# Patient Record
Sex: Male | Born: 1960 | Race: White | Hispanic: No
Health system: Southern US, Community
[De-identification: ages and names within clinical notes are randomized; demographics above are authoritative.]

---

## 2020-03-07 ENCOUNTER — Emergency Department (HOSPITAL_COMMUNITY): Payer: Medicare HMO

## 2020-03-07 ENCOUNTER — Emergency Department (HOSPITAL_COMMUNITY)
Admission: EM | Admit: 2020-03-07 | Discharge: 2020-03-07 | Disposition: A | Discharge status: Home / Self Care | Payer: Medicare HMO | Attending: Emergency Medicine | Admitting: Emergency Medicine

## 2020-03-07 ENCOUNTER — Other Ambulatory Visit: Payer: Self-pay

## 2020-03-07 ENCOUNTER — Encounter (HOSPITAL_COMMUNITY): Payer: Self-pay | Admitting: Emergency Medicine

## 2020-03-07 DIAGNOSIS — Z5321 Procedure and treatment not carried out due to patient leaving prior to being seen by health care provider: Secondary | ICD-10-CM | POA: Insufficient documentation

## 2020-03-07 DIAGNOSIS — R0789 Other chest pain: Secondary | ICD-10-CM | POA: Insufficient documentation

## 2020-03-07 LAB — BASIC METABOLIC PANEL
Anion gap: 13 (ref 5–15)
BUN: 17 mg/dL (ref 6–20)
CO2: 24 mmol/L (ref 22–32)
Calcium: 8.7 mg/dL — ABNORMAL LOW (ref 8.9–10.3)
Chloride: 98 mmol/L (ref 98–111)
Creatinine, Ser: 1.28 mg/dL — ABNORMAL HIGH (ref 0.61–1.24)
GFR calc Af Amer: >60 mL/min (ref >60)
GFR calc non Af Amer: >60 mL/min (ref >60)
Glucose, Bld: 108 mg/dL — ABNORMAL HIGH (ref 70–99)
Potassium: 4 mmol/L (ref 3.5–5.1)
Sodium: 135 mmol/L (ref 135–145)

## 2020-03-07 LAB — CBC
HCT: 41 % (ref 39.0–52.0)
Hemoglobin: 13.6 g/dL (ref 13.0–17.0)
MCH: 31 pg (ref 26.0–34.0)
MCHC: 33.2 g/dL (ref 30.0–36.0)
MCV: 93.4 fL (ref 80.0–100.0)
Platelets: 120 10*3/uL — ABNORMAL LOW (ref 150–400)
RBC: 4.39 MIL/uL (ref 4.22–5.81)
RDW: 13.4 % (ref 11.5–15.5)
WBC: 6.9 10*3/uL (ref 4.0–10.5)
nRBC: 0 % (ref 0.0–0.2)

## 2020-03-07 LAB — TROPONIN I (HIGH SENSITIVITY): Troponin I (High Sensitivity): 3 ng/L (ref <18)

## 2020-03-07 MED ORDER — SODIUM CHLORIDE 0.9% FLUSH: 3.0000 mL | Freq: Once | INTRAVENOUS | Status: DC

## 2020-03-07 NOTE — ED Notes (Signed)
Pt called to be roomed to 45. Did not respond to name being called. No where to be found in lobby and triage.

## 2020-03-07 NOTE — ED Triage Notes (Signed)
Pt arrives to ED from his eye doctor after a vitrectomy procedure in which he was sedated with versed and fentanyl. Per EMS when patient was in recovery he started to complain of tightness in his left chest. Patient states he is currently on a heart monitor for constat chest pain but this chest pain was different because it was left sided and not substernal.  Patient received x1 nitro with slight relief in chest pain.

## 2021-07-05 IMAGING — CR DG CHEST 2V
2 series · 2 of 2 positions shown · non-contrast
Comparison: None.

CLINICAL DATA: Chest pain and shortness of breath.

EXAM:
CHEST - 2 VIEW

[chest lat]
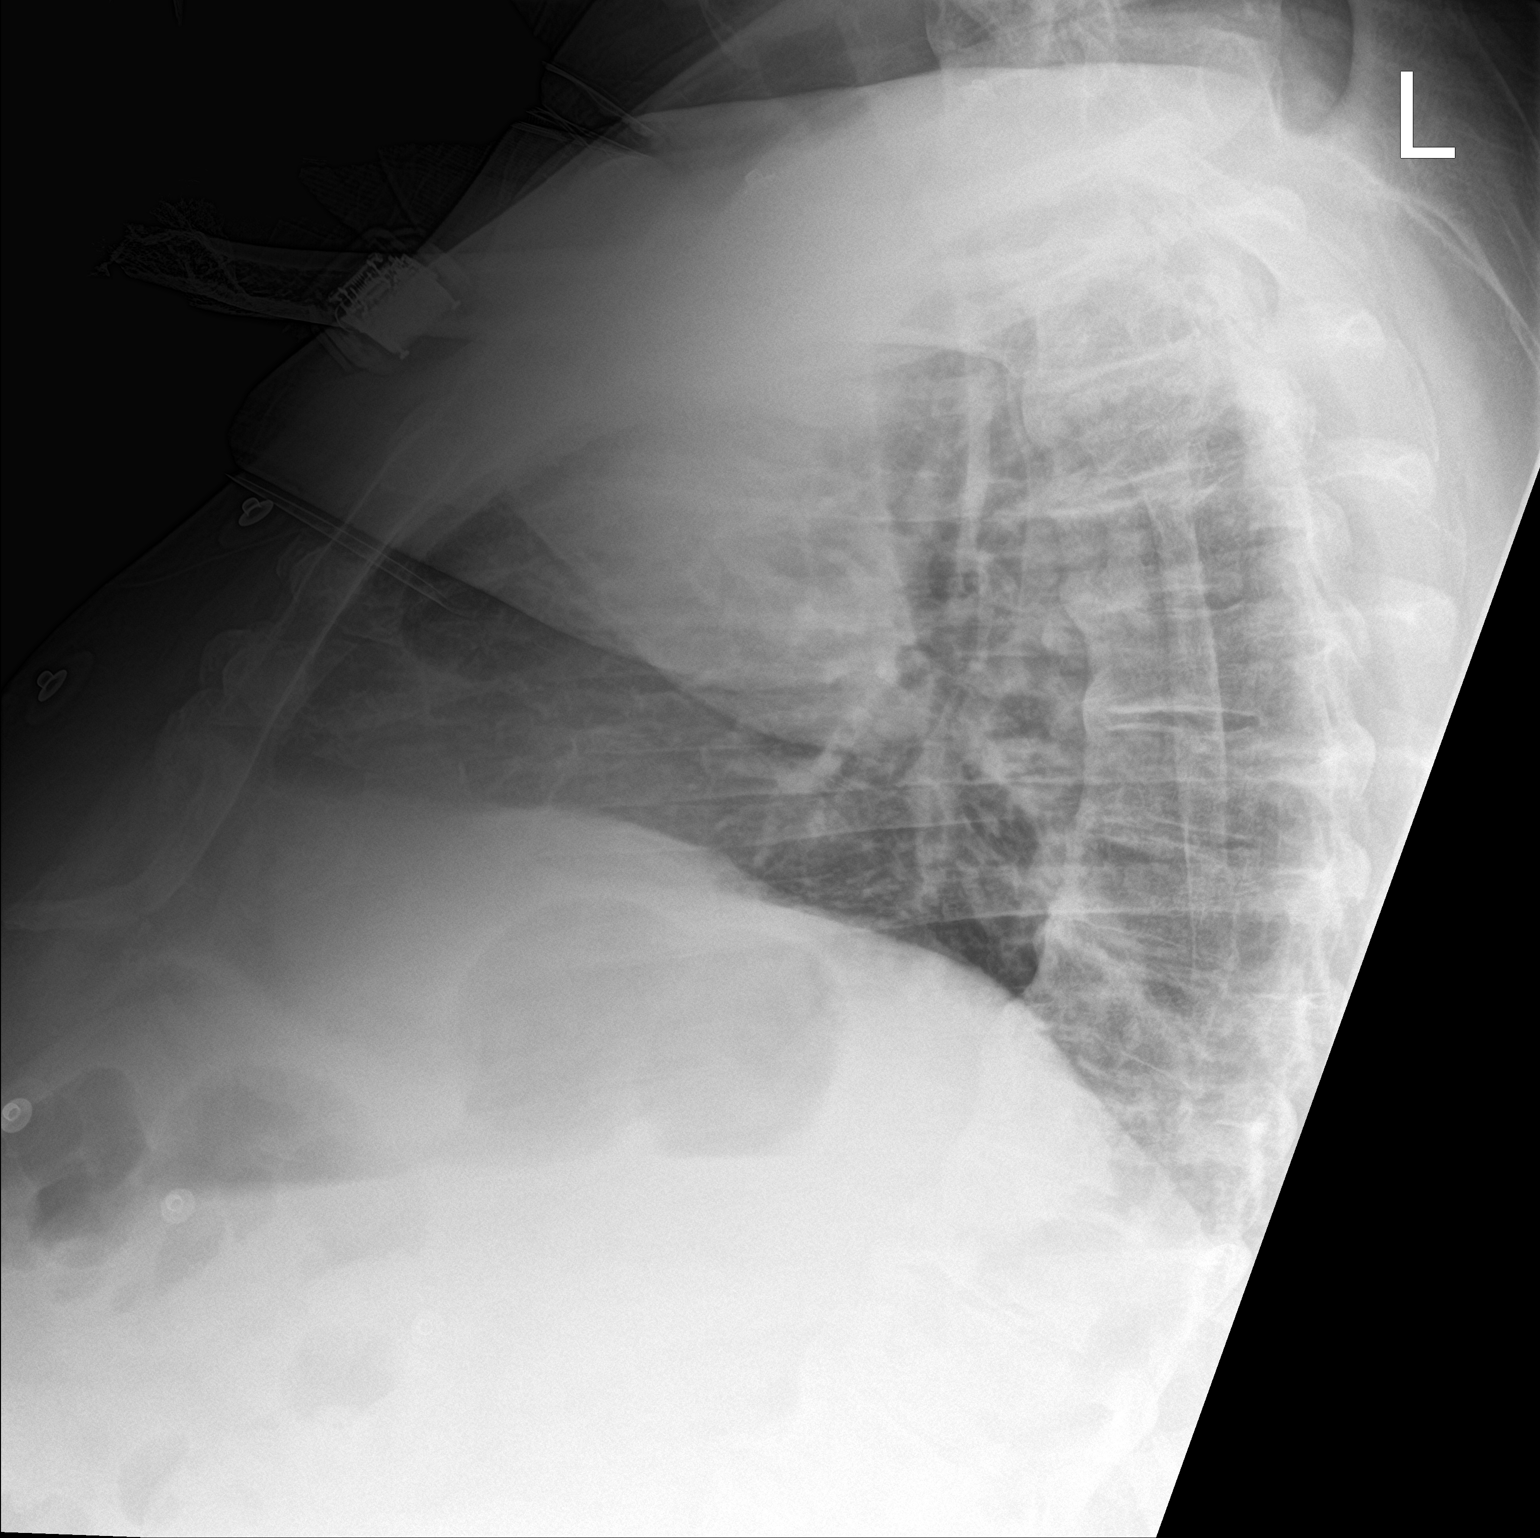

[chest ap]
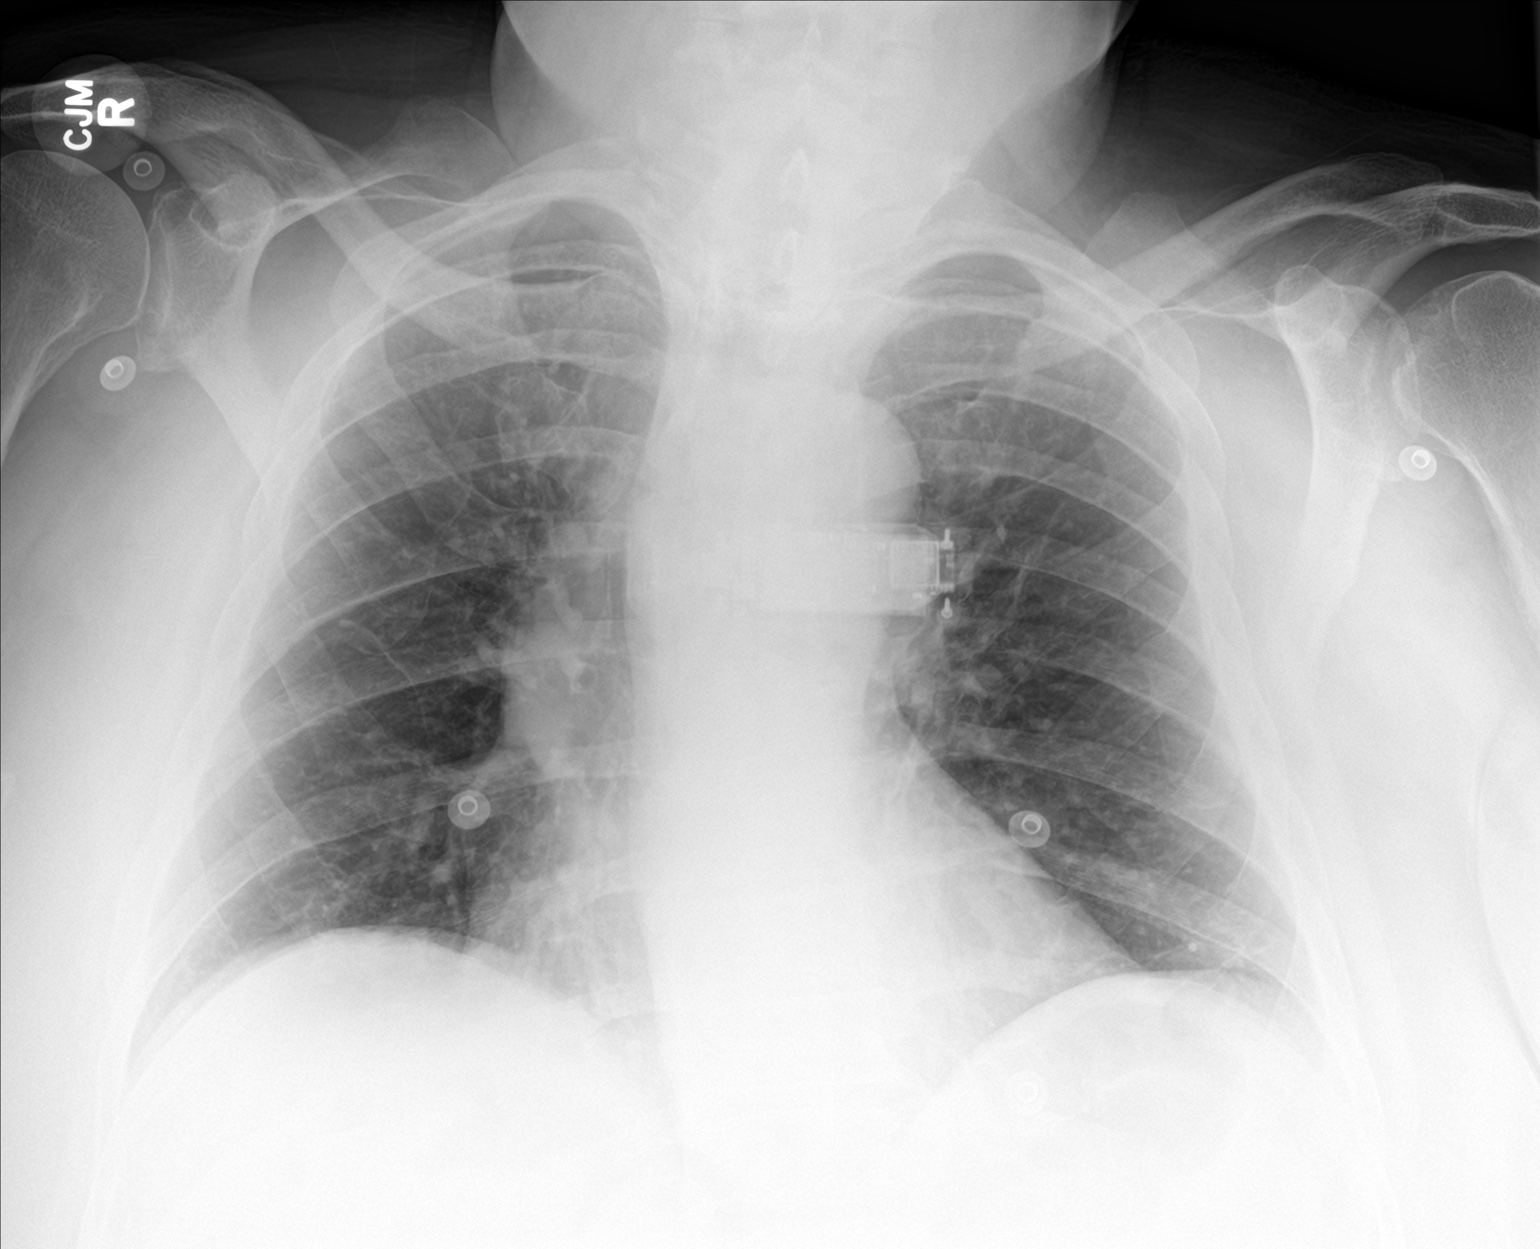

[2 of 2 positions shown; findings below may reference images not displayed]

FINDINGS: The heart size and mediastinal contours are within normal limits.
Both lungs are clear. The visualized skeletal structures are
unremarkable.
IMPRESSION: No active cardiopulmonary disease.
